# Patient Record
Sex: Female | Born: 1995 | Hispanic: No | Marital: Single | Smoking: Never smoker
Health system: Southern US, Community
[De-identification: ages and names within clinical notes are randomized; demographics above are authoritative.]

## PROBLEM LIST (undated history)

## (undated) DIAGNOSIS — D571 Sickle-cell disease without crisis: Secondary | ICD-10-CM

## (undated) DIAGNOSIS — A749 Chlamydial infection, unspecified: Secondary | ICD-10-CM

## (undated) HISTORY — PX: NO PAST SURGERIES: SHX2092

## (undated) HISTORY — DX: Sickle-cell disease without crisis: D57.1

---
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
## 2015-03-08 ENCOUNTER — Ambulatory Visit (INDEPENDENT_AMBULATORY_CARE_PROVIDER_SITE_OTHER): Payer: BLUE CROSS/BLUE SHIELD | Admitting: *Deleted

## 2015-03-08 DIAGNOSIS — Z3049 Encounter for surveillance of other contraceptives: Secondary | ICD-10-CM

## 2015-03-08 DIAGNOSIS — Z3042 Encounter for surveillance of injectable contraceptive: Secondary | ICD-10-CM

## 2015-03-08 MED ORDER — MEDROXYPROGESTERONE ACETATE 150 MG/ML IM SUSP
150.0000 mg | Freq: Once | INTRAMUSCULAR | Status: AC
  Administered 2015-03-08: 150 mg via INTRAMUSCULAR

## 2015-03-08 NOTE — Progress Notes (Signed)
Pt presents for depo injection. Pt within depo window, no urine hcg needed. Injection given, tolerated well. F/u depo injection visit scheduled.   

## 2015-03-11 ENCOUNTER — Encounter: Payer: Self-pay | Admitting: Family

## 2015-03-11 NOTE — Progress Notes (Signed)
Patient ID: Kathleen Farrell, female   DOB: May 06, 1995, 20 y.o.   MRN: [id number] Pre-Visit Planning  Azion Jalbert  is a 20 y.o. female referred by No PCP Per Patient.   Last seen in [facility] on 10/05/14 for contraceptive counseling.   Previous Psych Screenings? no  Treatment plan at last visit included contraceptive counseling, Depo provera. She was treated for chlamydia around 09/24/14.  She was last seen on 03/08/15 for depo injection.   Clinical Staff Visit Tasks:   - Urine GC/CT due? yes - Psych Screenings Due? no - fs hgb  Provider Visit Tasks: - assess bleeding, gyn concerns, evaluate contraceptive options - [facility] Involvement? No - Pertinent Labs? no

## 2015-03-12 ENCOUNTER — Encounter: Payer: Self-pay | Admitting: Family

## 2015-03-12 ENCOUNTER — Ambulatory Visit (INDEPENDENT_AMBULATORY_CARE_PROVIDER_SITE_OTHER): Payer: BLUE CROSS/BLUE SHIELD | Admitting: Family

## 2015-03-12 VITALS — BP 115/74 | HR 87 | Ht 64.57 in | Wt 128.6 lb

## 2015-03-12 DIAGNOSIS — R319 Hematuria, unspecified: Secondary | ICD-10-CM

## 2015-03-12 DIAGNOSIS — R634 Abnormal weight loss: Secondary | ICD-10-CM | POA: Diagnosis not present

## 2015-03-12 DIAGNOSIS — N941 Unspecified dyspareunia: Secondary | ICD-10-CM | POA: Diagnosis not present

## 2015-03-12 DIAGNOSIS — Z130 Encounter for screening for diseases of the blood and blood-forming organs and certain disorders involving the immune mechanism: Secondary | ICD-10-CM | POA: Diagnosis not present

## 2015-03-12 DIAGNOSIS — Z113 Encounter for screening for infections with a predominantly sexual mode of transmission: Secondary | ICD-10-CM

## 2015-03-12 DIAGNOSIS — N939 Abnormal uterine and vaginal bleeding, unspecified: Secondary | ICD-10-CM | POA: Diagnosis not present

## 2015-03-12 DIAGNOSIS — R350 Frequency of micturition: Secondary | ICD-10-CM

## 2015-03-12 LAB — CBC WITH DIFFERENTIAL/PLATELET
Basophils Absolute: 0 10*3/uL (ref 0.0–0.1)
Basophils Relative: 0 % (ref 0–1)
Eosinophils Absolute: 0.1 10*3/uL (ref 0.0–0.7)
Eosinophils Relative: 2 % (ref 0–5)
HEMATOCRIT: 39.3 % (ref 36.0–46.0)
Hemoglobin: 12.9 g/dL (ref 12.0–15.0)
LYMPHS PCT: 53 % — AB (ref 12–46)
Lymphs Abs: 2.7 10*3/uL (ref 0.7–4.0)
MCH: 29.5 pg (ref 26.0–34.0)
MCHC: 32.8 g/dL (ref 30.0–36.0)
MCV: 89.7 fL (ref 78.0–100.0)
MONOS PCT: 6 % (ref 3–12)
MPV: 9.3 fL (ref 8.6–12.4)
Monocytes Absolute: 0.3 10*3/uL (ref 0.1–1.0)
NEUTROS ABS: 2 10*3/uL (ref 1.7–7.7)
NEUTROS PCT: 39 % — AB (ref 43–77)
PLATELETS: 299 10*3/uL (ref 150–400)
RBC: 4.38 MIL/uL (ref 3.87–5.11)
RDW: 13.8 % (ref 11.5–15.5)
WBC: 5.1 10*3/uL (ref 4.0–10.5)

## 2015-03-12 LAB — POCT URINALYSIS DIPSTICK
BILIRUBIN UA: NEGATIVE
GLUCOSE UA: NEGATIVE
Ketones, UA: NEGATIVE
Leukocytes, UA: NEGATIVE
NITRITE UA: NEGATIVE
Spec Grav, UA: 1.025
Urobilinogen, UA: NEGATIVE
pH, UA: 5

## 2015-03-12 LAB — POCT HEMOGLOBIN: Hemoglobin: 12.7 g/dL (ref 12.2–16.2)

## 2015-03-12 NOTE — Progress Notes (Signed)
THIS RECORD MAY CONTAIN CONFIDENTIAL INFORMATION THAT SHOULD NOT BE RELEASED WITHOUT REVIEW OF THE SERVICE PROVIDER.  Adolescent Medicine Consultation Follow-Up Visit Kathleen Farrell  is a 20 y.o. female referred by No ref. provider found here today for follow-up.    Previsit planning completed:  Yes Patient ID: Kathleen Farrell, female   DOB: 1995-11-13, 20 y.o.   MRN: [id number] Pre-Visit Planning  Kathleen Farrell  is a 20 y.o. female referred by No PCP Per Patient.   Last seen in [facility] on 10/05/14 for contraceptive counseling.   Previous Psych Screenings? no  Treatment plan at last visit included contraceptive counseling, Depo provera. She was treated for chlamydia around 09/24/14.  She was last seen on 03/08/15 for depo injection.   Clinical Staff Visit Tasks:   - Urine GC/CT due? yes - Psych Screenings Due? no - fs hgb  Provider Visit Tasks: - assess bleeding, gyn concerns, evaluate contraceptive options - [facility] Involvement? No - Pertinent Labs? no    Growth Chart Viewed? no   History was provided by the patient.  PCP Confirmed?  no  My Chart Activated?   no   HPI:   Having side effects with Depo - continuous bleeding, has only stopped bleeding once since on Depo.  Shot on Friday - vaginal bleeding since Depo at that time. After first Depo shot she bled for the entire 3 months.  Weight fluctuations and irritability. Pain with intercourse - new symptom, more recently.  No known FH of thyroid disease. No new medications.  Wondered if she had a kidney infection - had some chills, 'can't find stable body temp'  No pain with urination now, having some frequency and urgency x 3-4 weeks.  No vaginal discharge changes.  No abdominal pain or pelvic pain outside of dyspareunia mentioned above.  ROS otherwise negative.    No LMP recorded. Allergies  Allergen Reactions  . Amoxicillin Hives and Swelling   Outpatient Encounter Prescriptions as of 03/12/2015  Medication  Sig  . ibuprofen (ADVIL,MOTRIN) 200 MG tablet Take 400-600 mg by mouth every 6 (six) hours as needed for headache or moderate pain.    No facility-administered encounter medications on file as of 03/12/2015.     Patient Active Problem List   Diagnosis Date Noted  . Routine screening for STI (sexually transmitted infection) 10/05/2014  . Negative pregnancy test 10/05/2014     Social History   Social History Narrative     The following portions of the patient's history were reviewed and updated as appropriate: allergies, current medications, past family history, past medical history, past social history, past surgical history and problem list.  Physical Exam:  Filed Vitals:   03/12/15 1613  BP: 115/74  Pulse: 87  Height: 5' 4.57" (1.64 m)  Weight: 128 lb 9.6 oz (58.333 kg)   BP 115/74 mmHg  Pulse 87  Ht 5' 4.57" (1.64 m)  Wt 128 lb 9.6 oz (58.333 kg)  BMI 21.69 kg/m2  LMP 02/25/2015 (Approximate) Body mass index: body mass index is 21.69 kg/(m^2). Blood pressure percentiles are [id number] systolic and 0000000 diastolic based on [id number] NHANES data. Blood pressure percentile targets: 90: 123/78, 95: 127/81, 99 + 5 mmHg: 139/94.  Lab Results  Component Value Date   HGB 12.7 03/12/2015    Wt Readings from Last 3 Encounters:  03/12/15 128 lb 9.6 oz (58.333 kg) (52 %*, Z = 0.04)  10/05/14 137 lb 9.6 oz (62.415 kg) (68 %*, Z = 0.47)  09/26/14 133 lb  9.6 oz (60.601 kg) (62 %*, Z = 0.31)   * Growth percentiles are based on CDC 2-20 Years data.    Physical Exam  Constitutional: She is oriented to person, place, and time. She appears well-developed. No distress.  HENT:  Head: Normocephalic and atraumatic.  Eyes: EOM are normal. Pupils are equal, round, and reactive to light. No scleral icterus.  Neck: Normal range of motion. Neck supple.  Cardiovascular: Normal rate.   Pulmonary/Chest: Effort normal.  Abdominal: Soft. She exhibits no distension and no mass. There is no tenderness. There  is no rebound and no guarding.  Genitourinary: Vagina normal and uterus normal.  No CMT or adnexal fullness noted.  Scant brownish/red blood present from os.   Musculoskeletal: Normal range of motion.  Neurological: She is alert and oriented to person, place, and time.  Skin: Skin is warm and dry. No rash noted. She is not diaphoretic.  Psychiatric: She has a normal mood and affect. Her behavior is normal. Judgment and thought content normal.  Vitals reviewed.   Assessment/Plan: 1. Vaginal bleeding -scant bleeding noted in otherwise normal exam.  -await lab results; consider imaging if negative results and bleeding persists (in the context of dyspareunia) -could be in context of depo-provera. She elects to hold out before changing contraceptive methods.  - WET PREP BY MOLECULAR PROBE  2. Dyspareunia in female -as per above.   3. Urinary frequency -UA dip reviewed. +blood inconsistent with scant blood noted on pelvic exam.  -will send for culture in context of frequency, blood  - POCT Urinalysis Dipstick - Urine Culture  4. Hematuria, unspecified -as per above - Urine Culture  5. Weight loss -not likely associated with depo-provera.  -will check thyroid, WBC and kidney functioning.   - Thyroid Panel With TSH - Comprehensive metabolic panel - CBC with Differential/Platelet  6. Screening for iron deficiency anemia -stable.  - POCT hemoglobin  7. Routine screening for STI (sexually transmitted infection) -in context of vaginal bleeding and painful intercourse -hx of chlamydia  - GC/Chlamydia Probe Amp  Follow-up:  Return pending lab results.   Medical decision-making:  > 25 minutes spent, more than 50% of appointment was spent discussing diagnosis and management of symptoms

## 2015-03-12 NOTE — Patient Instructions (Signed)
Please go to the 4th floor [place] lab for bloodwork.  Once those results are returned, you will hear from me about your follow-up.  Report any new or worsening symptoms.

## 2015-03-13 ENCOUNTER — Other Ambulatory Visit: Payer: Self-pay | Admitting: Family

## 2015-03-13 ENCOUNTER — Telehealth: Payer: Self-pay | Admitting: *Deleted

## 2015-03-13 DIAGNOSIS — N939 Abnormal uterine and vaginal bleeding, unspecified: Secondary | ICD-10-CM

## 2015-03-13 LAB — WET PREP BY MOLECULAR PROBE
Candida species: NEGATIVE
Gardnerella vaginalis: NEGATIVE
Trichomonas vaginosis: NEGATIVE

## 2015-03-13 LAB — COMPREHENSIVE METABOLIC PANEL
ALT: 8 U/L (ref 5–32)
AST: 15 U/L (ref 12–32)
Albumin: 4.5 g/dL (ref 3.6–5.1)
Alkaline Phosphatase: 36 U/L — ABNORMAL LOW (ref 47–176)
BUN: 13 mg/dL (ref 7–20)
CALCIUM: 9.4 mg/dL (ref 8.9–10.4)
CO2: 22 mmol/L (ref 20–31)
Chloride: 109 mmol/L (ref 98–110)
Creat: 0.86 mg/dL (ref 0.50–1.00)
Glucose, Bld: 64 mg/dL — ABNORMAL LOW (ref 65–99)
POTASSIUM: 4.1 mmol/L (ref 3.8–5.1)
Sodium: 137 mmol/L (ref 135–146)
Total Bilirubin: 0.7 mg/dL (ref 0.2–1.1)
Total Protein: 7.5 g/dL (ref 6.3–8.2)

## 2015-03-13 LAB — THYROID PANEL WITH TSH
FREE THYROXINE INDEX: 2.4 (ref 1.4–3.8)
T3 Uptake: 30 % (ref 22–35)
T4, Total: 8 ug/dL (ref 4.5–12.0)
TSH: 1.219 u[IU]/mL (ref 0.350–4.500)

## 2015-03-13 LAB — GC/CHLAMYDIA PROBE AMP
CT PROBE, AMP APTIMA: NOT DETECTED
GC PROBE AMP APTIMA: NOT DETECTED

## 2015-03-13 NOTE — Telephone Encounter (Signed)
TC to [facility]. Confirmed Pelvic US and Transvaginal US orders correct and visible.   TC to pt. Updated that: Thyroid labs were normal, as are your kidney and liver function labs. Electrolytes are also normal.      GC/C results were negative also. No sign of yeast, BV, or trichomonas.     Still waiting on your urine culture and will let you know if it shows an infection.     C Millican is going to order a pelvic ultrasound, which will include a transvaginal probe. Advised we just want her to be aware of that part of the imaging. This will allow [place] to make sure there are no anatomical issues with your bleeding.     There may be some other lab work, but we will get the imaging first.     Pt verbalized understanding. Pt given phone number for central scheduling. Will call to schedule [place].

## 2015-03-14 LAB — URINE CULTURE
COLONY COUNT: NO GROWTH
ORGANISM ID, BACTERIA: NO GROWTH

## 2015-03-18 ENCOUNTER — Other Ambulatory Visit: Payer: Self-pay | Admitting: Family

## 2015-03-18 ENCOUNTER — Ambulatory Visit (HOSPITAL_COMMUNITY)
Admission: RE | Admit: 2015-03-18 | Discharge: 2015-03-18 | Disposition: A | Discharge status: Home / Self Care | Payer: BLUE CROSS/BLUE SHIELD | Source: Ambulatory Visit | Attending: Family | Admitting: Family

## 2015-03-18 DIAGNOSIS — N939 Abnormal uterine and vaginal bleeding, unspecified: Secondary | ICD-10-CM | POA: Diagnosis not present

## 2015-03-19 ENCOUNTER — Telehealth: Payer: Self-pay | Admitting: *Deleted

## 2015-03-19 NOTE — Telephone Encounter (Signed)
  TC to pt. Updated that the imaging is normal of uterus and R ovary. There is a small left ovarian lesion, with about 2 cm in size, likely a dermoid cyst.      Advised that this is likely nto the reason you are bleeding and we will just monitor this again if concerning new symptoms.     However, advised pt that NP would like to get labs drawn to rule out any other causes of the bleeding. I have put the orders in. Just let us know when you can come by to get the labs drawn.Pt agreeable to call clinic to ensure lab tech available and have labs drawn.

## 2015-03-19 NOTE — Telephone Encounter (Signed)
-----   Message from Dierdre Harness, NP sent at [phone]  3:25 PM EST ----- The imaging is normal of uterus and R ovary. There is a small left ovarian lesion, with about 2 cm in size, likely a dermoid cyst.  This is likely no the reason you are bleeding and we will just monitor this again if concerning new symptoms.  However, I would like to get labs drawn to rule out any other causes of the bleeding. I have put the orders in. Just let us know when you can come by to get the labs drawn.  Advise if questions - cm

## 2015-03-19 NOTE — Telephone Encounter (Signed)
TC to pt, unable to LVM d/t VM box not being set up.  Will attempt to call pt at a later time.

## 2015-04-01 ENCOUNTER — Emergency Department (HOSPITAL_COMMUNITY)
Admission: EM | Admit: 2015-04-01 | Discharge: 2015-04-01 | Disposition: A | Discharge status: Home / Self Care | Payer: BLUE CROSS/BLUE SHIELD | Attending: Emergency Medicine | Admitting: Emergency Medicine

## 2015-04-01 ENCOUNTER — Encounter (HOSPITAL_COMMUNITY): Payer: Self-pay

## 2015-04-01 DIAGNOSIS — N939 Abnormal uterine and vaginal bleeding, unspecified: Secondary | ICD-10-CM | POA: Insufficient documentation

## 2015-04-01 DIAGNOSIS — R109 Unspecified abdominal pain: Secondary | ICD-10-CM | POA: Diagnosis not present

## 2015-04-01 DIAGNOSIS — R300 Dysuria: Secondary | ICD-10-CM | POA: Insufficient documentation

## 2015-04-01 LAB — COMPREHENSIVE METABOLIC PANEL
ALT: 12 U/L — AB (ref 14–54)
ANION GAP: 8 (ref 5–15)
AST: 18 U/L (ref 15–41)
Albumin: 4.3 g/dL (ref 3.5–5.0)
Alkaline Phosphatase: 36 U/L — ABNORMAL LOW (ref 38–126)
BUN: 14 mg/dL (ref 6–20)
CHLORIDE: 111 mmol/L (ref 101–111)
CO2: 22 mmol/L (ref 22–32)
CREATININE: 0.97 mg/dL (ref 0.44–1.00)
Calcium: 9.6 mg/dL (ref 8.9–10.3)
Glucose, Bld: 91 mg/dL (ref 65–99)
Potassium: 4.1 mmol/L (ref 3.5–5.1)
Sodium: 141 mmol/L (ref 135–145)
Total Bilirubin: 0.7 mg/dL (ref 0.3–1.2)
Total Protein: 7.5 g/dL (ref 6.5–8.1)

## 2015-04-01 LAB — LIPASE, BLOOD: LIPASE: 31 U/L (ref 11–51)

## 2015-04-01 LAB — URINALYSIS, ROUTINE W REFLEX MICROSCOPIC
Bilirubin Urine: NEGATIVE
GLUCOSE, UA: NEGATIVE mg/dL
Ketones, ur: NEGATIVE mg/dL
LEUKOCYTES UA: NEGATIVE
Nitrite: NEGATIVE
PROTEIN: NEGATIVE mg/dL
Specific Gravity, Urine: 1.024 (ref 1.005–1.030)
pH: 7 (ref 5.0–8.0)

## 2015-04-01 LAB — CBC
HCT: 38.5 % (ref 36.0–46.0)
Hemoglobin: 12.4 g/dL (ref 12.0–15.0)
MCH: 29.5 pg (ref 26.0–34.0)
MCHC: 32.2 g/dL (ref 30.0–36.0)
MCV: 91.4 fL (ref 78.0–100.0)
PLATELETS: 251 10*3/uL (ref 150–400)
RBC: 4.21 MIL/uL (ref 3.87–5.11)
RDW: 13.3 % (ref 11.5–15.5)
WBC: 4.2 10*3/uL (ref 4.0–10.5)

## 2015-04-01 LAB — URINE MICROSCOPIC-ADD ON

## 2015-04-01 NOTE — ED Notes (Signed)
Patient here with abdominal pain with dysuria for a few days, also reports some darker vaginal bleeding intermittently

## 2015-04-01 NOTE — ED Notes (Signed)
Patient called 3X for room with no response

## 2015-04-02 ENCOUNTER — Encounter: Payer: Self-pay | Admitting: Family

## 2015-04-02 ENCOUNTER — Ambulatory Visit (INDEPENDENT_AMBULATORY_CARE_PROVIDER_SITE_OTHER): Payer: BLUE CROSS/BLUE SHIELD | Admitting: Family

## 2015-04-02 VITALS — BP 122/69 | HR 81 | Ht 64.8 in | Wt 128.4 lb

## 2015-04-02 DIAGNOSIS — Z1389 Encounter for screening for other disorder: Secondary | ICD-10-CM | POA: Diagnosis not present

## 2015-04-02 DIAGNOSIS — R109 Unspecified abdominal pain: Secondary | ICD-10-CM | POA: Diagnosis not present

## 2015-04-02 DIAGNOSIS — R319 Hematuria, unspecified: Secondary | ICD-10-CM

## 2015-04-02 DIAGNOSIS — N939 Abnormal uterine and vaginal bleeding, unspecified: Secondary | ICD-10-CM

## 2015-04-02 DIAGNOSIS — N390 Urinary tract infection, site not specified: Secondary | ICD-10-CM

## 2015-04-02 DIAGNOSIS — Z130 Encounter for screening for diseases of the blood and blood-forming organs and certain disorders involving the immune mechanism: Secondary | ICD-10-CM | POA: Diagnosis not present

## 2015-04-02 LAB — POCT HEMOGLOBIN
Hemoglobin: 10.9 g/dL — AB (ref 12.2–16.2)
Hemoglobin: 11.8 g/dL — AB (ref 12.2–16.2)

## 2015-04-02 LAB — POCT URINALYSIS DIPSTICK
BILIRUBIN UA: NEGATIVE
GLUCOSE UA: NEGATIVE
KETONES UA: NEGATIVE
Nitrite, UA: NEGATIVE
SPEC GRAV UA: 1.02
Urobilinogen, UA: NEGATIVE
pH, UA: 5.5

## 2015-04-02 MED ORDER — NITROFURANTOIN MONOHYD MACRO 100 MG PO CAPS: 100.0000 mg | ORAL_CAPSULE | Freq: Two times a day (BID) | ORAL | Status: AC

## 2015-04-02 NOTE — Progress Notes (Signed)
Patient ID: Kathleen Farrell, female   DOB: [birth year], 20 y.o.   MRN: [id number] Pre-Visit Planning  Kathleen Farrell  is a 20 y.o. female referred by No PCP Per Patient.   Last seen in [facility] on 03/12/15 for vaginal bleeding.   Previous Psych Screenings? NA  Treatment plan at last visit included labs, gc/c, wet prep, UA + culture, and pelvic/vag imaging (03/18/15). Vaginal bleeding in context of Depo-Provera.   Clinical Staff Visit Tasks:   - Urine GC/CT due? no - Psych Screenings Due? NA - CC UA   Provider Visit Tasks: - Assess symptoms, review labs with patient from last night's ER visit.  [facility] Involvement? No - Pertinent Labs? Yes - see EPIC notes from last night. Reviewed.

## 2015-04-02 NOTE — Patient Instructions (Signed)
Begin Macrobid today and take exactly as prescribed.  I am sending a urine culture and will call you in a few days to ensure this antibiotic will take care of the infection.  I am also sending you to urology for a work-up related to this persistent problem.  If you experience any of the following, please go immediately to the ER:  Fever, chills, nausea, vomiting, increased flank (back) pain, visible blood in urine, or if you are unable to urinate, or any new or worsening symptoms.

## 2015-04-02 NOTE — Progress Notes (Signed)
THIS RECORD MAY CONTAIN CONFIDENTIAL INFORMATION THAT SHOULD NOT BE RELEASED WITHOUT REVIEW OF THE SERVICE PROVIDER.  Adolescent Medicine Consultation Follow-Up Visit Kathleen Farrell  is a 20 y.o. female referred by No ref. provider found here today for follow-up.    Previsit planning completed:  yes  Growth Chart Viewed? no   History was provided by the patient.  PCP Confirmed?  no  My Chart Activated?   no   HPI:    Last Saturday noticed pain with urination, difficulty starting a stream of urine. Worse throughout this week. Having terrible pain in midsection of back, really uncomfortable. Had chills, unsure if feverish. Sees drops of blood irregularly when in BR, unsure if vaginal or urethral. No n/v.  Still having VB - usually brown/old blood. Has not been a lot of bleeding, may fill up a tampon every 3-4 hours 1/2 full.  Having sensitivity in back pain, stinging, burning, sometimes just uncomfortable, bilateral.  In front of abdomen same way.  Normal BMs - couple times/day, no bleeding noted, not tarry.  Eating and drinking normally - trying to drink more water, almost clear urine.  Has been trying to quit sodas - first one yesterday in 2 weeks.  ROS otherwise negative.   Patient's last menstrual period was 03/26/2015 (approximate). Allergies  Allergen Reactions  . Amoxicillin Hives and Swelling   Outpatient Encounter Prescriptions as of 04/02/2015  Medication Sig  . ibuprofen (ADVIL,MOTRIN) 200 MG tablet Take 400-600 mg by mouth every 6 (six) hours as needed for headache or moderate pain.    No facility-administered encounter medications on file as of 04/02/2015.     Patient Active Problem List   Diagnosis Date Noted  . Routine screening for STI (sexually transmitted infection) 10/05/2014  . Negative pregnancy test 10/05/2014    Social History   Social History Narrative     The following portions of the patient's history were reviewed and updated as appropriate:  allergies, current medications, past family history, past medical history, past social history, past surgical history and problem list.  Physical Exam:  Filed Vitals:   04/02/15 0859  BP: 122/69  Pulse: 81  Height: 5' 4.8" (1.646 m)  Weight: 128 lb 6.4 oz (58.242 kg)    BP 122/69 mmHg  Pulse 81  Ht 5' 4.8" (1.646 m)  Wt 128 lb 6.4 oz (58.242 kg)  BMI 21.50 kg/m2  LMP 03/26/2015 (Approximate) Body mass index: body mass index is 21.5 kg/(m^2). Blood pressure percentiles are A999333 systolic and A999333 diastolic based on [id number] NHANES data. Blood pressure percentile targets: 90: 123/77, 95: 127/81, 99 + 5 mmHg: 139/94.  Temperature: 98.5 F   Lab Results  Component Value Date   HGB 11.8* 04/02/2015   Rechecked Hgb in office visit. Reviewed with patient.    Physical Exam  Constitutional: She is oriented to person, place, and time. She appears well-developed and well-nourished.  HENT:  Head: Normocephalic and atraumatic.  Eyes: EOM are normal. Pupils are equal, round, and reactive to light. No scleral icterus.  Neck: Normal range of motion. Neck supple.  Cardiovascular: Normal rate, regular rhythm and normal heart sounds.   No murmur heard. Pulmonary/Chest: Effort normal and breath sounds normal.  Abdominal: Soft. Bowel sounds are normal. She exhibits no distension and no mass. There is tenderness. There is no rebound and no guarding.  Negative for suprapubic pain   Genitourinary:  deferred  Musculoskeletal: Normal range of motion. She exhibits no edema or tenderness.  + flank bilateral  Neurological: She is alert and oriented to person, place, and time.  Skin: Skin is warm and dry. No rash noted. She is not diaphoretic.  Psychiatric: She has a normal mood and affect.    Plan:  1. Urinary tract infection with hematuria, site unspecified Treat empirically; send for culture.  Afebrile. ER precautions given, patient and BF verbalized understanding.  May use ibuprofen and tylenol  for pain.  Will send to urology for further workup related to persistent hematuria.   - nitrofurantoin, macrocrystal-monohydrate, (MACROBID) 100 MG capsule; Take 1 capsule (100 mg total) by mouth 2 (two) times daily.  Dispense: 14 capsule; Refill: 0  - Ambulatory referral to Urology  - Urine Culture   2. Flank pain -as per above, bilateral.   3. Screening for genitourinary condition -per protocol  - POCT urinalysis dipstick  4. Screening for iron deficiency anemia Repeat stick. 11.8; reviewed with patient.  -ER precautions and return precautions reviewed. Patient verbalized understanding.  - POCT hemoglobin - POCT hemoglobin  5. Vaginal bleeding  -Ambulatory referral to GYN  -follow-up on L dermoid ovarian cyst noted on 03/18/15 Pelvic US, in the context of vaginal bleeding.  Depo for contraceptive.   Follow-up:  Return in about 3 days (around 04/05/2015).  Will call patient with culture results.  Medical decision-making:  > 25 minutes spent, more than 50% of appointment was spent discussing diagnosis and management of symptoms

## 2015-04-05 ENCOUNTER — Ambulatory Visit (INDEPENDENT_AMBULATORY_CARE_PROVIDER_SITE_OTHER): Payer: BLUE CROSS/BLUE SHIELD | Admitting: Family

## 2015-04-05 ENCOUNTER — Encounter: Payer: Self-pay | Admitting: Family

## 2015-04-05 VITALS — BP 117/58 | HR 105 | Ht 64.29 in | Wt 132.0 lb

## 2015-04-05 DIAGNOSIS — D271 Benign neoplasm of left ovary: Secondary | ICD-10-CM | POA: Diagnosis not present

## 2015-04-05 DIAGNOSIS — N390 Urinary tract infection, site not specified: Secondary | ICD-10-CM | POA: Diagnosis not present

## 2015-04-05 DIAGNOSIS — R319 Hematuria, unspecified: Secondary | ICD-10-CM | POA: Diagnosis not present

## 2015-04-05 LAB — URINE CULTURE: Colony Count: >=100000

## 2015-04-05 NOTE — Progress Notes (Signed)
THIS RECORD MAY CONTAIN CONFIDENTIAL INFORMATION THAT SHOULD NOT BE RELEASED WITHOUT REVIEW OF THE SERVICE PROVIDER.  Adolescent Medicine Consultation Follow-Up Visit Kathleen Farrell  is a 20 y.o. female referred by No ref. provider found here today for follow-up.    Previsit planning completed:  yes  Growth Chart Viewed? not applicable   History was provided by the patient.  PCP Confirmed?  yes  My Chart Activated?   pending   HPI:    Today she has questions about the ultrasound results and about the urology appointments.  For her UTI symptoms, she still has some back pain, but it is much better. No abdominal pain. Burning with urination improved. Taking antibiotics, no side effects.  Patient's last menstrual period was 03/26/2015 (approximate). Allergies  Allergen Reactions  . Amoxicillin Hives and Swelling   Outpatient Encounter Prescriptions as of 04/05/2015  Medication Sig  . ibuprofen (ADVIL,MOTRIN) 200 MG tablet Take 400-600 mg by mouth every 6 (six) hours as needed for headache or moderate pain.   . nitrofurantoin, macrocrystal-monohydrate, (MACROBID) 100 MG capsule Take 1 capsule (100 mg total) by mouth 2 (two) times daily.   No facility-administered encounter medications on file as of 04/05/2015.     Patient Active Problem List   Diagnosis Date Noted  . Flank pain 04/02/2015  . Hematuria 04/02/2015  . Vaginal bleeding 04/02/2015  . Routine screening for STI (sexually transmitted infection) 10/05/2014  . Negative pregnancy test 10/05/2014    Social History   Social History Narrative     The following portions of the patient's history were reviewed and updated as appropriate: allergies, current medications, past family history, past medical history, past social history, past surgical history and problem list.  Physical Exam:  Filed Vitals:   04/05/15 1555  BP: 117/58  Pulse: 105  Height: 5' 4.29" (1.633 m)  Weight: 132 lb (59.875 kg)   BP 117/58 mmHg  Pulse  105  Ht 5' 4.29" (1.633 m)  Wt 132 lb (59.875 kg)  BMI 22.45 kg/m2  LMP 03/26/2015 (Approximate) Body mass index: body mass index is 22.45 kg/(m^2). Blood pressure percentiles are 123XX123 systolic and 123456 diastolic based on [id number] NHANES data. Blood pressure percentile targets: 90: 122/77, 95: 126/81, 99 + 5 mmHg: 138/94.  Physical Exam  Constitutional: She appears well-developed and well-nourished. No distress.  Cardiovascular: Normal rate and regular rhythm.   Pulmonary/Chest: Effort normal and breath sounds normal.  Abdominal: Soft. She exhibits no distension. There is no tenderness. There is no rebound and no guarding.  Musculoskeletal:  Bilateral tenderness along lower back muscles. No CVA tenderness on right, mild CVA tenderness on left.  Neurological: She is alert.  Psychiatric: She has a normal mood and affect.   Assessment/Plan: 1. Urinary tract infection with hematuria, site unspecified - symptoms are improving, will continue course of macrobid - return precautions discussed - will call with urine culture results  2. Dermoid cyst of left ovary - I spoke with the radiologist, Dr. Maryland Pink, who read the pelvic ultrasound. He is 99% sure that this is a dermoid cyst as it is very hyperechoic with the classic shadowing. It is definitely NOT an abscess. He does not have any imaging recommendations, however he says that most of these cases are referred to OB/GYN who monitor and will do surgery to remove when they get too big. Ovarian torsion becomes a problem when the dermoid cyst is >4cm.  Follow-up:  Return if symptoms worsen or fail to improve.   Medical decision-making:  >  25 minutes spent, more than 50% of appointment was spent discussing diagnosis and management of symptoms  E. Angela Burke, MD [facility], PGY-2 04/05/2015  4:43 PM

## 2015-04-05 NOTE — Patient Instructions (Signed)
You have a dermoid cyst that is very small (2cm). Because these can grow, we will make a referral to OB/GYN today. They will determine if it is getting so big that the dermoid will need to be removed.  Ovarian Cyst An ovarian cyst is a sac filled with fluid or blood. This sac is attached to the ovary. Some cysts go away on their own. Other cysts need treatment.  HOME CARE   Only take medicine as told by your doctor.  Follow up with your doctor as told.  Get regular pelvic exams and Pap tests. GET HELP IF:  Your periods are late, not regular, or painful.  You stop having periods.  Your belly (abdominal) or pelvic pain does not go away.  Your belly becomes large or puffy (swollen).  You have a hard time peeing (totally emptying your bladder).  You have pressure on your bladder.  You have pain during sex.  You feel fullness, pressure, or discomfort in your belly.  You lose weight for no reason.  You feel sick most of the time.  You have a hard time pooping (constipation).  You do not feel like eating.  You develop pimples (acne).  You have an increase in hair on your body and face.  You are gaining weight for no reason.  You think you are pregnant. GET HELP RIGHT AWAY IF:   Your belly pain gets worse.  You feel sick to your stomach (nauseous), and you throw up (vomit).  You have a fever that comes on fast.  You have belly pain while pooping (bowel movement).  Your periods are heavier than usual. MAKE SURE YOU:   Understand these instructions.  Will watch your condition.  Will get help right away if you are not doing well or get worse.   This information is not intended to replace advice given to you by your health care provider. Make sure you discuss any questions you have with your health care provider.   Document Released: 07/15/2007 Document Revised: 11/16/2012 Document Reviewed: 10/03/2012 Elsevier Interactive Patient Education [employer].

## 2015-04-09 ENCOUNTER — Telehealth: Payer: Self-pay | Admitting: *Deleted

## 2015-04-09 NOTE — Telephone Encounter (Signed)
-----   Message from Dierdre Harness, NP sent at [id number]  1:46 PM EST ----- Urine culture came back positive for UTI and the bacteria is sensitive to Macrobid, which explains why you were feeling better after starting the medication last week.  Advise if new or worsening symptoms. Also, gynecology referral was released so you should be contacted about scheduling that appointment soon.

## 2015-04-09 NOTE — Telephone Encounter (Signed)
Pt's phone number has been disconnected at this time.

## 2015-04-11 ENCOUNTER — Encounter: Payer: Self-pay | Admitting: Obstetrics & Gynecology

## 2015-05-10 ENCOUNTER — Encounter: Payer: Self-pay | Admitting: Obstetrics & Gynecology

## 2015-05-10 ENCOUNTER — Ambulatory Visit: Payer: BLUE CROSS/BLUE SHIELD | Admitting: Obstetrics & Gynecology

## 2015-05-10 ENCOUNTER — Encounter (HOSPITAL_COMMUNITY): Payer: Self-pay | Admitting: *Deleted

## 2015-05-10 VITALS — BP 98/79 | HR 74 | Temp 98.4°F | Ht 64.0 in | Wt 130.0 lb

## 2015-05-10 DIAGNOSIS — D369 Benign neoplasm, unspecified site: Secondary | ICD-10-CM

## 2015-05-24 ENCOUNTER — Ambulatory Visit (INDEPENDENT_AMBULATORY_CARE_PROVIDER_SITE_OTHER): Payer: BLUE CROSS/BLUE SHIELD | Admitting: *Deleted

## 2015-05-24 DIAGNOSIS — Z3042 Encounter for surveillance of injectable contraceptive: Secondary | ICD-10-CM

## 2015-05-24 DIAGNOSIS — Z3049 Encounter for surveillance of other contraceptives: Secondary | ICD-10-CM | POA: Diagnosis not present

## 2015-05-24 MED ORDER — MEDROXYPROGESTERONE ACETATE 150 MG/ML IM SUSP
150.0000 mg | Freq: Once | INTRAMUSCULAR | Status: AC
  Administered 2015-05-24: 150 mg via INTRAMUSCULAR

## 2015-05-24 NOTE — Progress Notes (Signed)
Pt presents for depo injection. Pt within depo window, no urine hcg needed. Injection given, tolerated well. F/u depo injection visit scheduled.   

## 2015-06-12 NOTE — Patient Instructions (Signed)
Your procedure is scheduled on:  Tuesday, Jun 25, 2015  Enter through the Main Entrance of [facility] at: 8:15 AM  Pick up the phone at the desk and dial [phone].  Call this number if you have problems the morning of surgery: [phone].  Remember:  Do NOT eat food or drink after:  Midnight Monday, Jun 24, 2015  Take these medicines the morning of surgery with a SIP OF WATER: None  Do NOT wear jewelry (body piercing), metal hair clips/bobby pins, make-up, or nail polish. Do NOT wear lotions, powders, or perfumes.  You may wear deodorant. Do NOT shave for 48 hours prior to surgery. Do NOT bring valuables to the hospital. Contacts, dentures, or bridgework may not be worn into surgery.  Have a responsible adult drive you home and stay with you for 24 hours after your procedure

## 2015-06-14 ENCOUNTER — Inpatient Hospital Stay (HOSPITAL_COMMUNITY)
Admission: RE | Admit: 2015-06-14 | Discharge: 2015-06-14 | Disposition: A | Discharge status: Home / Self Care | Payer: BLUE CROSS/BLUE SHIELD | Source: Ambulatory Visit

## 2015-06-25 ENCOUNTER — Ambulatory Visit (HOSPITAL_COMMUNITY)
Admission: RE | Admit: 2015-06-25 | Payer: BLUE CROSS/BLUE SHIELD | Source: Ambulatory Visit | Admitting: Obstetrics & Gynecology

## 2015-06-25 ENCOUNTER — Encounter (HOSPITAL_COMMUNITY): Admission: RE | Payer: Self-pay | Source: Ambulatory Visit

## 2015-06-25 SURGERY — EXCISION, CYST, OVARY, LAPAROSCOPIC
Anesthesia: Choice | Site: Abdomen | Laterality: Left

## 2015-08-09 ENCOUNTER — Ambulatory Visit: Payer: Self-pay | Admitting: *Deleted

## 2015-09-09 ENCOUNTER — Ambulatory Visit: Payer: Self-pay | Admitting: *Deleted

## 2016-03-21 IMAGING — US US TRANSVAGINAL NON-OB
1 series · 15 of 25 positions shown · non-contrast
Comparison: None

CLINICAL DATA: Continuous bleeding



[Series 1: us transvaginal non-ob · 15 of 98 slices shown]
[im 1/98]
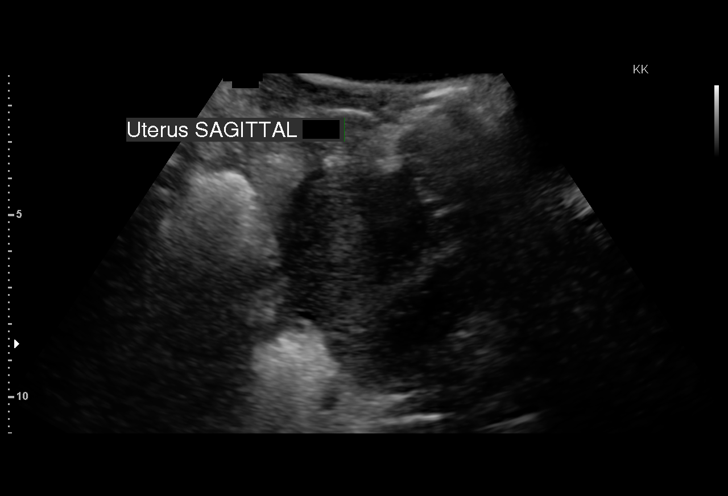
[im 9/98]
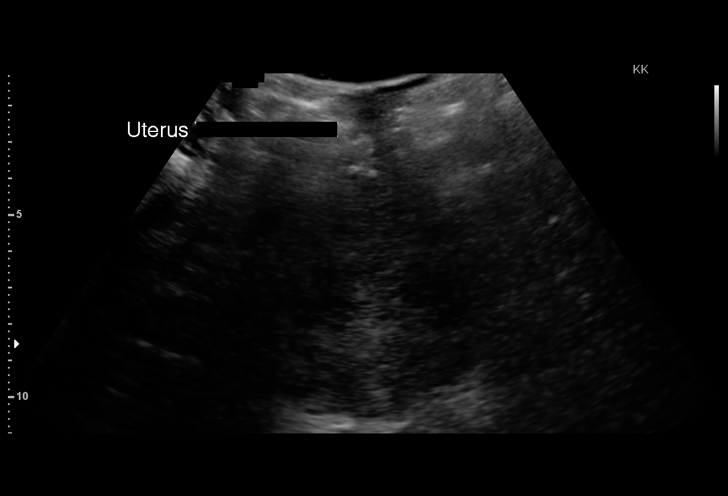
[im 17/98]
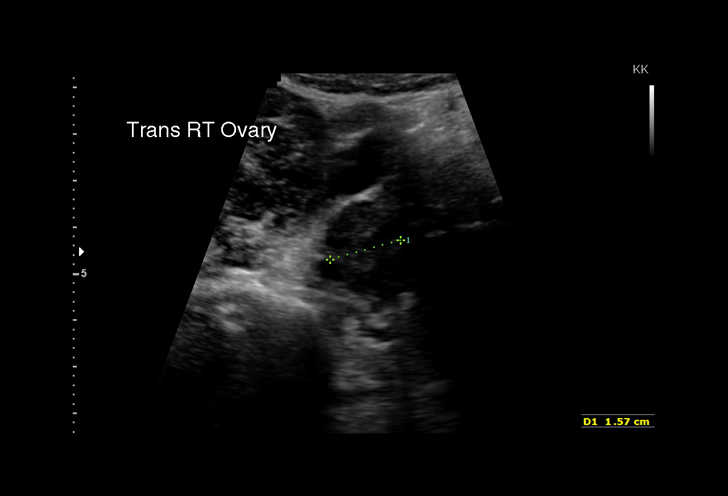
[im 21/98]
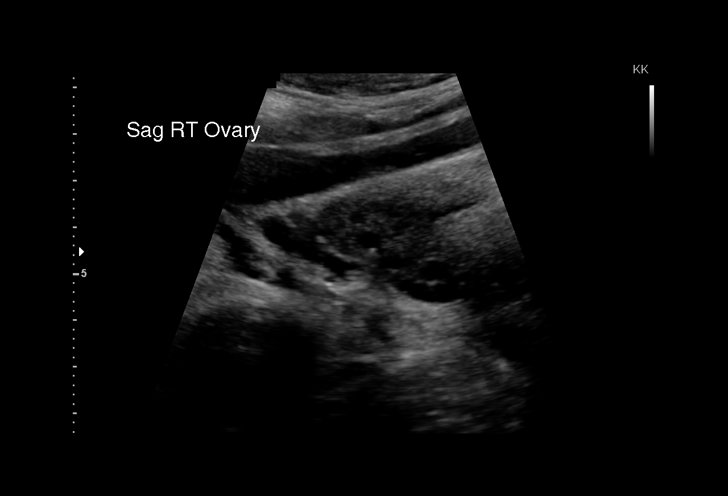
[im 29/98]
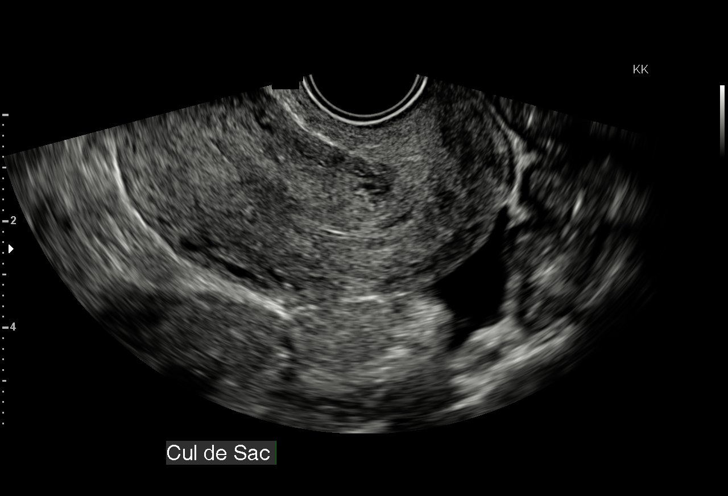
[im 37/98]
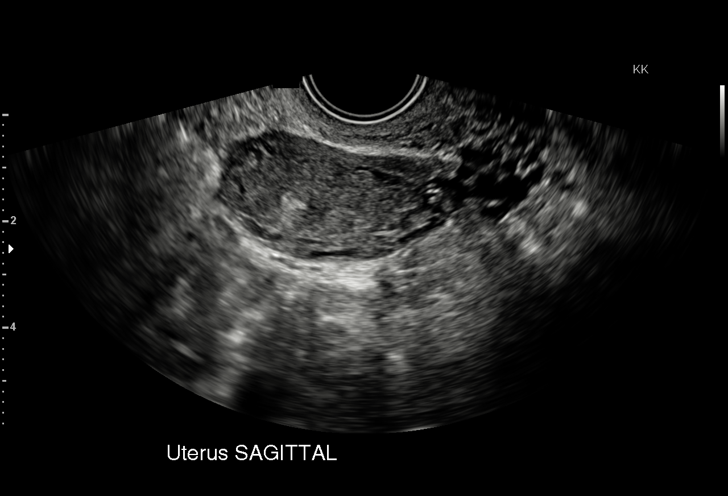
[im 41/98]
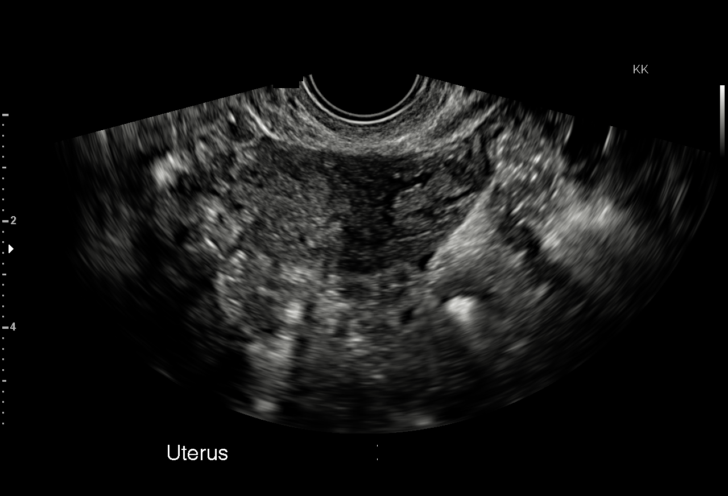
[im 49/98]
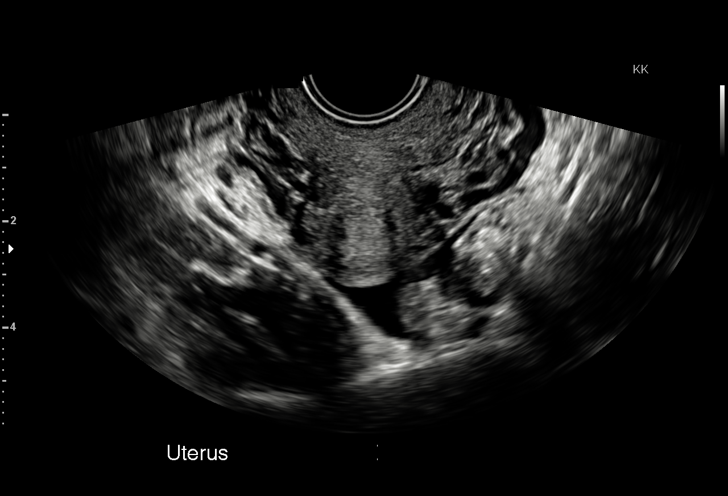
[im 57/98]
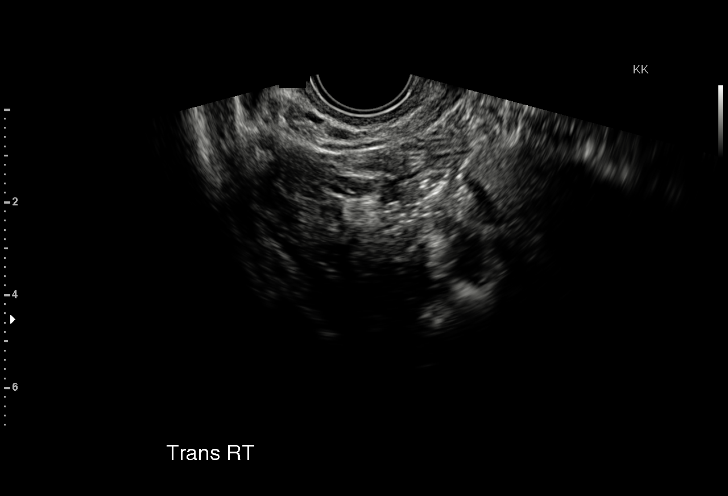
[im 61/98]
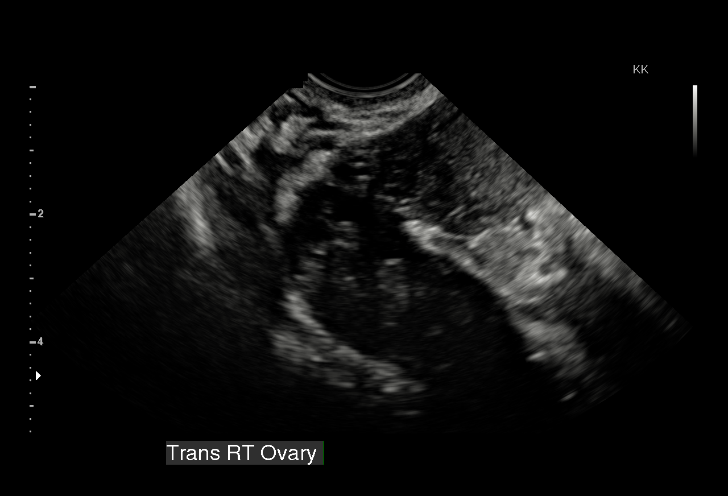
[im 69/98]
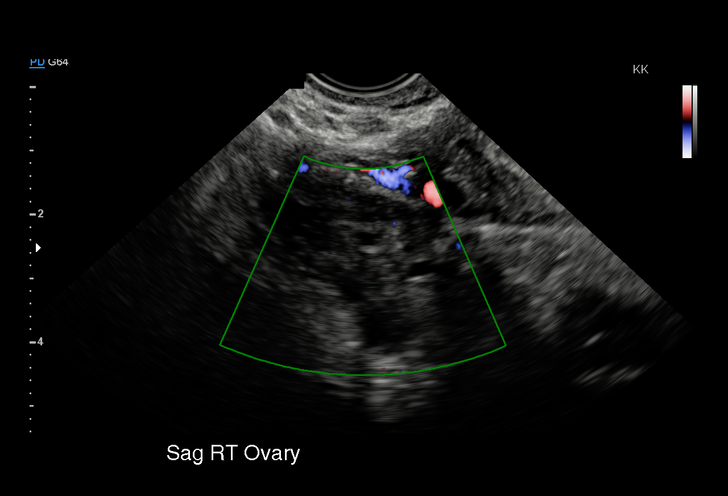
[im 77/98]
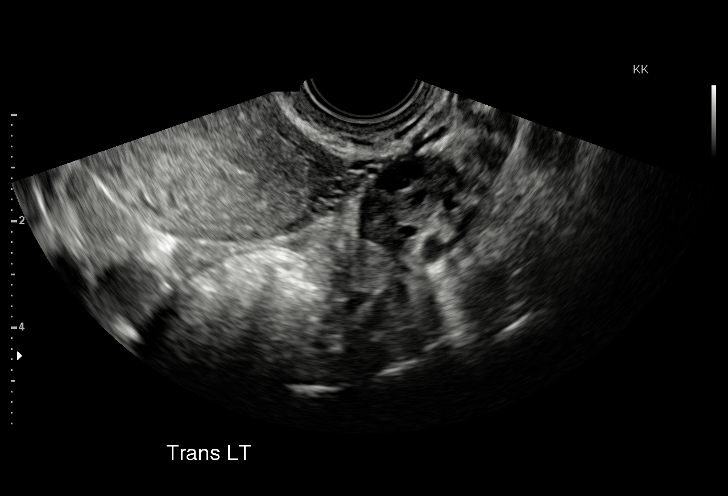
[im 81/98]
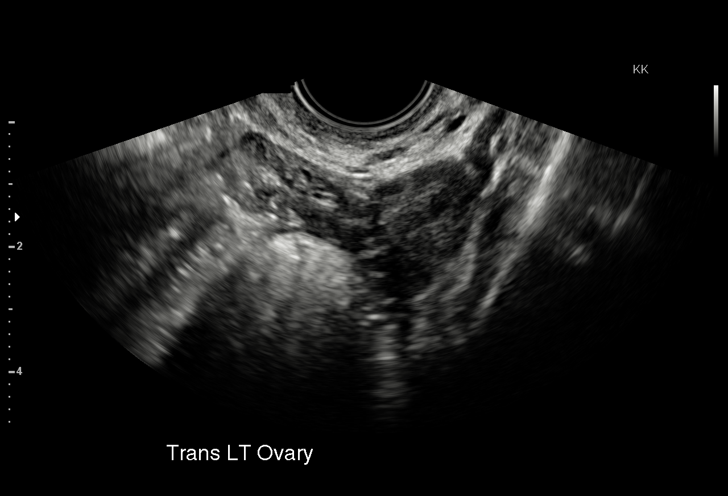
[im 89/98]
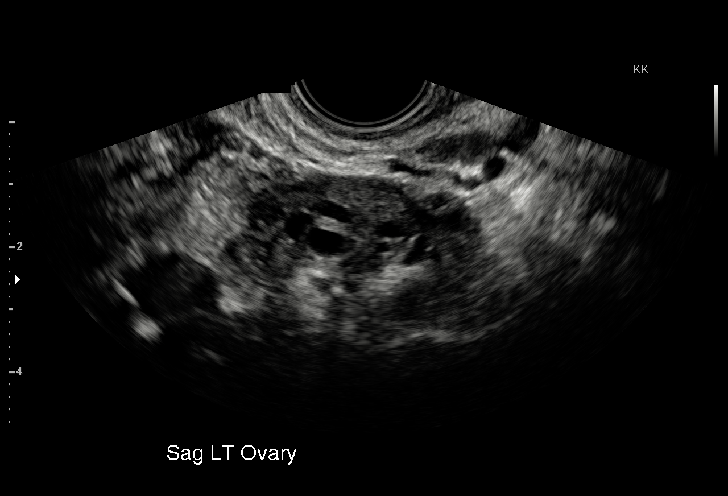
[im 98/98]
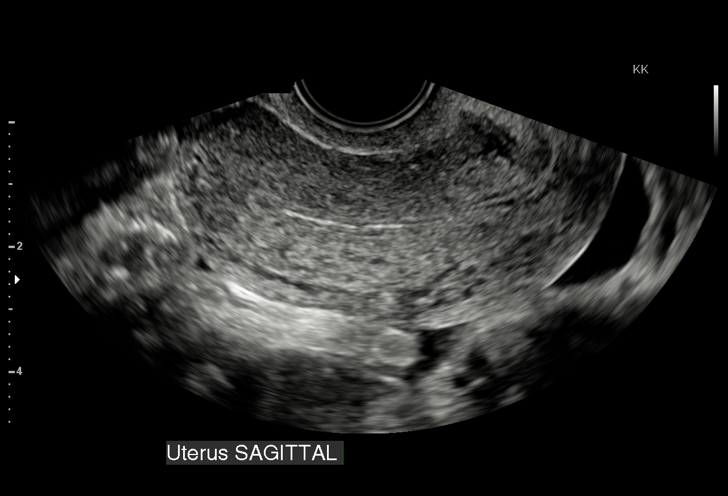

[15 of 25 positions shown; findings below may reference images not displayed]

FINDINGS: Uterus

Measurements: 9.2 x 3.4 x 5.4 cm. No fibroids or other mass
visualized.

Endometrium

Thickness: 4 mm.  No focal abnormality visualized.

Right ovary

Measurements: 3.0 x 2.0 x 1.9 cm. Normal appearance/no adnexal mass.

Left ovary

Measurements: 2.8 x 2.5 x 2.7 cm. 2.0 x 1.9 x 1.8 cm hyperechoic
left ovarian lesion, without color Doppler flow, favored to reflect
a dermoid.

Other findings

Small volume pelvic ascites, likely physiologic.
IMPRESSION: Normal sonographic appearance of the uterus and right ovary.

Endometrial complex measures 4 mm, within normal limits.

2.0 cm hyperechoic left ovarian lesion, favored to reflect a benign
ovarian dermoid.

## 2016-04-29 DIAGNOSIS — J069 Acute upper respiratory infection, unspecified: Secondary | ICD-10-CM | POA: Diagnosis not present

## 2016-05-03 ENCOUNTER — Ambulatory Visit (HOSPITAL_COMMUNITY)
Admission: EM | Admit: 2016-05-03 | Discharge: 2016-05-03 | Disposition: A | Discharge status: Home / Self Care | Payer: BLUE CROSS/BLUE SHIELD | Attending: Family Medicine | Admitting: Family Medicine

## 2016-05-03 ENCOUNTER — Encounter (HOSPITAL_COMMUNITY): Payer: Self-pay | Admitting: Emergency Medicine

## 2016-05-03 DIAGNOSIS — J0191 Acute recurrent sinusitis, unspecified: Secondary | ICD-10-CM

## 2016-05-03 MED ORDER — PREDNISONE 50 MG PO TABS: ORAL_TABLET | ORAL | 0 refills | Status: AC

## 2016-05-03 MED ORDER — PREDNISONE 20 MG PO TABS
60.0000 mg | ORAL_TABLET | Freq: Once | ORAL | Status: AC
  Administered 2016-05-03: 60 mg via ORAL

## 2016-05-03 MED ORDER — IPRATROPIUM BROMIDE 0.06 % NA SOLN: 2.0000 | Freq: Four times a day (QID) | NASAL | 12 refills | Status: AC

## 2016-05-03 MED ORDER — PREDNISONE 20 MG PO TABS
ORAL_TABLET | ORAL | Status: AC
  Filled 2016-05-03: qty 3

## 2016-05-03 NOTE — ED Triage Notes (Signed)
The patient presented to the [facility] with a complaint of sinus pain and pressure x 4 days. The patient reported finishing a course of zithromax for a URI today.

## 2016-05-03 NOTE — Discharge Instructions (Signed)
It is likely you have congestion and inflammation in the sinuses. Continue taking the pseudoephedrine for decongestant. If you have side effects from this medicine stop it and take the Sudafed PE 10 mg every 4 hours instead. Start taking the prednisone today. Take with food. Also recommend taking a nonsedating antihistamine such as Claritin, Zyrtec or Allegra. I am prescribing a nasal spray called Atrovent which will help with runny nose and dripping. This is optional if this is bothering you. Also recommend that she start a steroid nasal spray to use for at least 1 month daily. Examples are Flonase and Rhinocort. These are over-the-counter. The most likely reason for this sinusitis is due to allergies or viral. Plenty of fluids stay well-hydrated. Use the sinus rinses to help rinse out the sinuses. He could obtain these at drug stores and [facility].

## 2016-05-03 NOTE — ED Provider Notes (Signed)
CSN: [id number]     Arrival date & time 05/03/16  1349 History   First MD Initiated Contact with Patient 05/03/16 1456     Chief Complaint  Patient presents with  . Facial Pain   (Consider location/radiation/quality/duration/timing/severity/associated sxs/prior Treatment) 21 year old female who was seen by a primary care provider 4 days ago and diagnosed with a URI and treated with a Z-Pak. She completed that today. She is complaining of facial pain, paranasal, maxillary and frontal sinus pain as well as likely referred pain to the upper teeth. She is currently taking pseudoephedrine OTC medication. She has taken 2 tablets and it did offer some minimal relief but it did not take away all of her pain. She has a clear nasal discharge. Her ears feel a little stopped up. No fever.      Past Medical History:  Diagnosis Date  . Chlamydia   . Sickle cell anemia (HCC)    trait   Past Surgical History:  Procedure Laterality Date  . NO PAST SURGERIES     History reviewed. No pertinent family history. Social History  Substance Use Topics  . Smoking status: Never Smoker  . Smokeless tobacco: Never Used  . Alcohol use No   OB History    Gravida Para Term Preterm AB Living   1       1 0   SAB TAB Ectopic Multiple Live Births   1             Review of Systems  Constitutional: Negative.   HENT: Positive for congestion, rhinorrhea, sinus pain and sinus pressure. Negative for ear discharge, ear pain, facial swelling and sore throat.   Eyes: Negative.   Respiratory: Negative.   Gastrointestinal: Negative.   Neurological: Negative.   All other systems reviewed and are negative.   Allergies  Amoxicillin  Home Medications   Prior to Admission medications   Medication Sig Start Date End Date Taking? Authorizing Provider  azithromycin (ZITHROMAX) 250 MG tablet Take by mouth daily.   Yes Historical Provider, MD  ipratropium (ATROVENT) 0.06 % nasal spray Place 2 sprays into both  nostrils 4 (four) times daily. 05/03/16   Janne Napoleon, NP  predniSONE (DELTASONE) 50 MG tablet 1 tab po daily for 6 days. Take with food. 05/03/16   Janne Napoleon, NP   Meds Ordered and Administered this Visit  Medications - No data to display  BP 117/66 (BP Location: Right Arm)   Pulse (!) 104   Temp 98.6 F (37 C) (Oral)   Resp 18   SpO2 98%  No data found.   Physical Exam  Constitutional: She is oriented to person, place, and time. She appears well-developed and well-nourished. No distress.  HENT:  Mouth/Throat: No oropharyngeal exudate.  Bilateral TMs are retracted. No erythema. Oropharynx with minor erythema and moderate amount of clear PND.  Leaning forward causes increased pressure pain in the maxillary and para nasal sinus areas. Positive for percussion tenderness.  Eyes: EOM are normal.  Neck: Normal range of motion. Neck supple.  Cardiovascular: Normal rate, regular rhythm, normal heart sounds and intact distal pulses.   Borderline tachycardia. Likely secondary to the pseudoephedrine.  Pulmonary/Chest: Effort normal and breath sounds normal. No respiratory distress. She has no wheezes. She has no rales.  Musculoskeletal: She exhibits no edema.  Lymphadenopathy:    She has no cervical adenopathy.  Neurological: She is alert and oriented to person, place, and time. She exhibits normal muscle tone.  Skin: Skin is warm  and dry.  Psychiatric: She has a normal mood and affect.  Nursing note and vitals reviewed.   Urgent Care Course     Procedures (including critical care time)  Labs Review Labs Reviewed - No data to display  Imaging Review No results found.   Visual Acuity Review  Right Eye Distance:   Left Eye Distance:   Bilateral Distance:    Right Eye Near:   Left Eye Near:    Bilateral Near:         MDM   1. Acute recurrent sinusitis, unspecified location    It is likely you have congestion and inflammation in the sinuses. Continue taking the  pseudoephedrine for decongestant. If you have side effects from this medicine stop it and take the Sudafed PE 10 mg every 4 hours instead. Start taking the prednisone today. Take with food. Also recommend taking a nonsedating antihistamine such as Claritin, Zyrtec or Allegra. I am prescribing a nasal spray called Atrovent which will help with runny nose and dripping. This is optional if this is bothering you. Also recommend that she start a steroid nasal spray to use for at least 1 month daily. Examples are Flonase and Rhinocort. These are over-the-counter. The most likely reason for this sinusitis is due to allergies or viral. Plenty of fluids stay well-hydrated. Use the sinus rinses to help rinse out the sinuses. He could obtain these at drug stores and [facility]. Meds ordered this encounter  Medications  . azithromycin (ZITHROMAX) 250 MG tablet    Sig: Take by mouth daily.  . predniSONE (DELTASONE) 50 MG tablet    Sig: 1 tab po daily for 6 days. Take with food.    Dispense:  6 tablet    Refill:  0    Order Specific Question:   Supervising Provider    Answer:   Robyn Haber [[id number]]  . ipratropium (ATROVENT) 0.06 % nasal spray    Sig: Place 2 sprays into both nostrils 4 (four) times daily.    Dispense:  15 mL    Refill:  12    Order Specific Question:   Supervising Provider    Answer:   Robyn Haber [[id number]]   Prednisone 60 mg po now.   Janne Napoleon, NP 05/03/16 1526

## 2017-03-29 NOTE — Progress Notes (Signed)
Erroneous visit

## 2017-07-14 DIAGNOSIS — F330 Major depressive disorder, recurrent, mild: Secondary | ICD-10-CM | POA: Diagnosis not present

## 2017-07-21 DIAGNOSIS — F4323 Adjustment disorder with mixed anxiety and depressed mood: Secondary | ICD-10-CM | POA: Diagnosis not present

## 2017-08-04 DIAGNOSIS — F4323 Adjustment disorder with mixed anxiety and depressed mood: Secondary | ICD-10-CM | POA: Diagnosis not present

## 2017-08-18 DIAGNOSIS — F330 Major depressive disorder, recurrent, mild: Secondary | ICD-10-CM | POA: Diagnosis not present

## 2017-08-23 DIAGNOSIS — F4323 Adjustment disorder with mixed anxiety and depressed mood: Secondary | ICD-10-CM | POA: Diagnosis not present

## 2018-08-06 DIAGNOSIS — L0501 Pilonidal cyst with abscess: Secondary | ICD-10-CM | POA: Diagnosis not present
